# Patient Record
Sex: Male | Born: 2005 | Race: White | Hispanic: No | Marital: Single
Health system: Southern US, Community
[De-identification: ages and names within clinical notes are randomized; demographics above are authoritative.]

---
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
## 2024-03-09 ENCOUNTER — Ambulatory Visit
Admission: EM | Admit: 2024-03-09 | Discharge: 2024-03-09 | Disposition: A | Discharge status: Home / Self Care | Attending: Nurse Practitioner | Admitting: Nurse Practitioner

## 2024-03-09 DIAGNOSIS — Z8619 Personal history of other infectious and parasitic diseases: Secondary | ICD-10-CM

## 2024-03-09 DIAGNOSIS — R21 Rash and other nonspecific skin eruption: Secondary | ICD-10-CM | POA: Diagnosis not present

## 2024-03-09 MED ORDER — METHYLPREDNISOLONE 4 MG PO TBPK: ORAL_TABLET | ORAL | 0 refills | Status: AC

## 2024-03-09 MED ORDER — DEXAMETHASONE SODIUM PHOSPHATE 10 MG/ML IJ SOLN
10.0000 mg | Freq: Once | INTRAMUSCULAR | Status: AC
  Administered 2024-03-09: 10 mg via INTRAMUSCULAR

## 2024-03-09 NOTE — ED Triage Notes (Signed)
 Pt present with a rash on th lt wrist, torso and on his thighs x 2 days. Pt states he has used a different soap in the past 2 days. States he also is concerned to have contracted syphilis again.

## 2024-03-09 NOTE — ED Provider Notes (Signed)
 [facility]    CSN: [id number] Arrival date & time: 03/09/24  1153      History   Chief Complaint Chief Complaint  Patient presents with   Rash    HPI Osias Resnick is a 18 y.o. male.   Rash in July 2024    Was informed by sexual partner that he has tested positive for syphilis which prompted him to get tested. He had no genital or anal lesions.  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  Complete PCN x 3 rounds in September  Initial testing and 1st treatment with [facility]  [facility] [place] who also did 2nd & 3rd treatment  Last RPR was Jan 2025 and pt reports that numbers are decreasing (done at [facility])  Due to have RPR rechecked in 5 days, 03/14/24   Current rash started acutely 1 day ago  Patient noted rash to arms, lower abdomen and bilateral inner thighs  Small circular, erythematous rash  Mildly itching No sore throat  Current rash is similar to previous rash in July 2024 which was thought to be pityriasis. Rash resolved after steroids.  Patient reports that [facility] could never confirm that the rash prior was associated with syphilis or not but felt that it would be an unusual presentation for secondary syphilis related rash   Patient endorses new exposures recently  Staying at boyfriend house recently and also started a new soap  Started on left hand. Spread to left wrist. Now to torso, inner thighs and right arm   No contacts with similar rash   Haven't tried anything for rash   Denies St, fevers, nausea, vomiting, myalgias, weight loss   Rash  Most likley due to contact dermatitis given new exposures but secodnary sylipphis can not be ruled  Rash today appears similar to previous rash in July which [facility] thought would be an unusual presentation for secondary syphilis related rash  Treat as contact dermatitis with steroids  Defer syphillis management and  eval to [facility]             History reviewed. No pertinent past medical history.  There are no active problems to display for this patient.   History reviewed. No pertinent surgical history.     Home Medications    Prior to Admission medications   Medication Sig Start Date End Date Taking? Authorizing Provider  amoxicillin (AMOXIL) 500 MG capsule Take 1 capsule (500 mg total) by mouth 3 (three) times daily. 07/14/14   Hayden Rasmussen, NP  antipyrine-benzocaine Lyla Son) otic solution Place 3-4 drops into both ears every 2 (two) hours as needed for ear pain. 07/14/14   Hayden Rasmussen, NP    Family History History reviewed. No pertinent family history.  Social History Social History   Tobacco Use   Smoking status: Passive Smoke Exposure - Never Smoker  Substance Use Topics   Alcohol use: No   Drug use: Never     Allergies   Patient has no known allergies.   Review of Systems Review of Systems   Physical Exam Triage Vital Signs ED Triage Vitals  Encounter Vitals Group     BP 03/09/24 1415 128/74     Systolic BP Percentile --      Diastolic BP Percentile --      Pulse Rate 03/09/24 1415 67     Resp 03/09/24 1415 18     Temp 03/09/24 1415 98.5 F (36.9 C)  Temp Source 03/09/24 1415 Oral     SpO2 03/09/24 1415 98 %     Weight --      Height --      Head Circumference --      Peak Flow --      Pain Score 03/09/24 1414 0     Pain Loc --      Pain Education --      Exclude from Growth Chart --    No data found.  Updated Vital Signs BP 128/74 (BP Location: Right Arm)   Pulse 67   Temp 98.5 F (36.9 C) (Oral)   Resp 18   SpO2 98%   Visual Acuity Right Eye Distance:   Left Eye Distance:   Bilateral Distance:    Right Eye Near:   Left Eye Near:    Bilateral Near:     Physical Exam   UC Treatments / Results  Labs (all labs ordered are listed, but only abnormal results are displayed) Labs Reviewed - No data to  display  EKG   Radiology No results found.  Procedures Procedures (including critical care time)  Medications Ordered in UC Medications - No data to display  Initial Impression / Assessment and Plan / UC Course  I have reviewed the triage vital signs and the nursing notes.  Pertinent labs & imaging results that were available during my care of the patient were reviewed by me and considered in my medical decision making (see chart for details).     *** Final Clinical Impressions(s) / UC Diagnoses   Final diagnoses:  None   Discharge Instructions   None    ED Prescriptions   None    PDMP not reviewed this encounter.

## 2024-03-09 NOTE — Discharge Instructions (Addendum)
 The clinical appearance and distribution of your rash are more suggestive of contact dermatitis, especially in the setting of new environmental exposures. While secondary syphilis cannot be definitively ruled out, it is considered unlikely. You were given an injection steroids in the clinic and prescribed a course of oral steroids for symptom management. Management of your syphilis and further evaluation should be continued with the [facility], where you have been consistently receiving treatment and follow-up.
